# Patient Record
Sex: Male | Born: 1985 | Race: Black or African American | Hispanic: No | Marital: Married | Smoking: Never smoker
Health system: Southern US, Community
[De-identification: ages and names within clinical notes are randomized; demographics above are authoritative.]

## PROBLEM LIST (undated history)

## (undated) DIAGNOSIS — K5792 Diverticulitis of intestine, part unspecified, without perforation or abscess without bleeding: Secondary | ICD-10-CM

## (undated) HISTORY — DX: Diverticulitis of intestine, part unspecified, without perforation or abscess without bleeding: K57.92

---

## 2020-04-07 ENCOUNTER — Emergency Department (HOSPITAL_COMMUNITY): Payer: Medicaid Other

## 2020-04-07 ENCOUNTER — Emergency Department (HOSPITAL_COMMUNITY)
Admission: EM | Admit: 2020-04-07 | Discharge: 2020-04-07 | Payer: Medicaid Other | Attending: Emergency Medicine | Admitting: Emergency Medicine

## 2020-04-07 ENCOUNTER — Other Ambulatory Visit: Payer: Self-pay

## 2020-04-07 DIAGNOSIS — R042 Hemoptysis: Secondary | ICD-10-CM | POA: Diagnosis present

## 2020-04-07 LAB — COMPREHENSIVE METABOLIC PANEL
ALT: 61 U/L — ABNORMAL HIGH (ref 0–44)
AST: 72 U/L — ABNORMAL HIGH (ref 15–41)
Albumin: 4.8 g/dL (ref 3.5–5.0)
Alkaline Phosphatase: 73 U/L (ref 38–126)
Anion gap: 9 (ref 5–15)
BUN: 7 mg/dL (ref 6–20)
CO2: 25 mmol/L (ref 22–32)
Calcium: 9.3 mg/dL (ref 8.9–10.3)
Chloride: 102 mmol/L (ref 98–111)
Creatinine, Ser: 1.3 mg/dL — ABNORMAL HIGH (ref 0.61–1.24)
GFR, Estimated: >60 mL/min (ref >60)
Glucose, Bld: 91 mg/dL (ref 70–99)
Potassium: 5.3 mmol/L — ABNORMAL HIGH (ref 3.5–5.1)
Sodium: 136 mmol/L (ref 135–145)
Total Bilirubin: 0.9 mg/dL (ref 0.3–1.2)
Total Protein: 8.2 g/dL — ABNORMAL HIGH (ref 6.5–8.1)

## 2020-04-07 LAB — D-DIMER, QUANTITATIVE: D-Dimer, Quant: 0.29 ug/mL-FEU (ref 0.00–0.50)

## 2020-04-07 LAB — CBC WITH DIFFERENTIAL/PLATELET
Abs Immature Granulocytes: 0.03 10*3/uL (ref 0.00–0.07)
Basophils Absolute: 0.1 10*3/uL (ref 0.0–0.1)
Basophils Relative: 1 %
Eosinophils Absolute: 0.1 10*3/uL (ref 0.0–0.5)
Eosinophils Relative: 1 %
HCT: 45 % (ref 39.0–52.0)
Hemoglobin: 15.7 g/dL (ref 13.0–17.0)
Immature Granulocytes: 0 %
Lymphocytes Relative: 41 %
Lymphs Abs: 2.9 10*3/uL (ref 0.7–4.0)
MCH: 29.6 pg (ref 26.0–34.0)
MCHC: 34.9 g/dL (ref 30.0–36.0)
MCV: 84.7 fL (ref 80.0–100.0)
Monocytes Absolute: 0.6 10*3/uL (ref 0.1–1.0)
Monocytes Relative: 9 %
Neutro Abs: 3.4 10*3/uL (ref 1.7–7.7)
Neutrophils Relative %: 48 %
Platelets: 340 10*3/uL (ref 150–400)
RBC: 5.31 MIL/uL (ref 4.22–5.81)
RDW: 13.2 % (ref 11.5–15.5)
WBC: 7.1 10*3/uL (ref 4.0–10.5)
nRBC: 0 % (ref 0.0–0.2)

## 2020-04-07 MED ORDER — ACETAMINOPHEN 500 MG PO TABS
1000.0000 mg | ORAL_TABLET | Freq: Once | ORAL | Status: AC
  Administered 2020-04-07: 1000 mg via ORAL
  Filled 2020-04-07: qty 2

## 2020-04-07 NOTE — ED Triage Notes (Signed)
[facility] states pt in jail for 2 days. Yesterday started shob and coughing up blood, normally lives at home with wife.  Vitals 130/80 Hr 76  CBG116 98-100 RA 98.6 temp Staff at jail witnessed him cough up blood at jail, got dizzy and laid down to ground.  Denies anything that may have caused bleeding.  HX diverticulitis , cpap, anxiety Lung sounds clear per ems

## 2020-04-07 NOTE — ED Notes (Signed)
Pt from jail, officer at bedside

## 2020-04-07 NOTE — Discharge Instructions (Signed)
As discussed, your evaluation today has been largely reassuring.  But, it is important that you monitor your condition carefully, and do not hesitate to return to the ED if you develop new, or concerning changes in your condition. ? ?Otherwise, please follow-up with your physician for appropriate ongoing care. ? ?

## 2020-04-07 NOTE — ED Notes (Signed)
Police officer at bedside/ patient in handcuffs

## 2020-04-07 NOTE — ED Provider Notes (Signed)
[facility]-EMERGENCY DEPT Provider Note   CSN: [id number] Arrival date & time: 04/07/20  1611     History Chief Complaint  Patient presents with   Cough    with blood    Johnathan [facility] is a 34 y.o. male.  HPI  Patient presents from jail after an episode of hemoptysis.  Patient states that he has no medical problems, does not smoke, has been incarcerated for about 9 days.  Today, it seems though he had an episode of hemoptysis.  He also describes an episode of vomiting, but that seems to have been post tussive emesis. He answers most review of systems questioning in the affirmative, but it seems as though his presentation is secondary to the process, as above. Per nursing/police department, the patient had episode of hemoptysis, lay down on the ground, and was brought here for evaluation.    No past medical history on file. Past medical: Anxiety Past surgical: None Social: No smoking.  Currently incarcerated, married. Home Medications Prior to Admission medications   Not on File    Allergies    Patient has no allergy information on record.  Review of Systems   Review of Systems  Constitutional:       Per HPI, otherwise negative  HENT:       Per HPI, otherwise negative  Respiratory:       Per HPI, otherwise negative  Cardiovascular:       Per HPI, otherwise negative  Gastrointestinal: Positive for vomiting.  Endocrine:       Negative aside from HPI  Genitourinary:       Neg aside from HPI   Musculoskeletal:       Per HPI, otherwise negative  Skin: Negative.   Neurological: Negative for syncope and weakness.  Psychiatric/Behavioral: The patient is nervous/anxious.     Physical Exam Updated Vital Signs BP 119/73 (BP Location: Right Arm)    Pulse 65    Temp 98.9 F (37.2 C) (Oral)    Resp 20    Ht 5\' 5"  (1.651 m)    Wt 131.5 kg    SpO2 99%    BMI 48.26 kg/m   Physical Exam Vitals and nursing note reviewed.  Constitutional:       General: He is not in acute distress.    Appearance: He is well-developed.  HENT:     Head: Normocephalic and atraumatic.  Eyes:     Conjunctiva/sclera: Conjunctivae normal.  Cardiovascular:     Rate and Rhythm: Normal rate and regular rhythm.  Pulmonary:     Effort: Pulmonary effort is normal. No respiratory distress.     Breath sounds: No stridor.  Abdominal:     General: There is no distension.     Tenderness: There is no abdominal tenderness. There is no guarding.  Skin:    General: Skin is warm and dry.  Neurological:     Mental Status: He is alert and oriented to person, place, and time.     ED Results / Procedures / Treatments   Labs (all labs ordered are listed, but only abnormal results are displayed) Labs Reviewed  COMPREHENSIVE METABOLIC PANEL - Abnormal; Notable for the following components:      Result Value   Potassium 5.3 (*)    Creatinine, Ser 1.30 (*)    Total Protein 8.2 (*)    AST 72 (*)    ALT 61 (*)    All other components within normal limits  CBC WITH DIFFERENTIAL/PLATELET  D-DIMER, QUANTITATIVE (NOT AT [facility])    EKG EKG Interpretation  Date/Time:  Saturday April 07 2020 16:35:55 EDT Ventricular Rate:  83 PR Interval:    QRS Duration: 78 QT Interval:  347 QTC Calculation: 408 R Axis:   67 Text Interpretation: Sinus rhythm Consider left atrial enlargement unremarkable ECG Confirmed by Gerhard Munch [phone]) on 04/07/2020 5:05:54 PM   Radiology DG Chest Port 1 View  Result Date: 04/07/2020 CLINICAL DATA:  Hemoptysis EXAM: PORTABLE CHEST 1 VIEW COMPARISON:  None. FINDINGS: The heart size and mediastinal contours are within normal limits. Both lungs are clear. The visualized skeletal structures are unremarkable. IMPRESSION: No active disease. Electronically Signed   By: Jonna Clark M.D.   On: 04/07/2020 17:22    Procedures Procedures (including critical care time)  Medications Ordered in ED Medications  acetaminophen (TYLENOL) tablet  1,000 mg (has no administration in time range)    ED Course  I have reviewed the triage vital signs and the nursing notes.  Pertinent labs & imaging results that were available during my care of the patient were reviewed by me and considered in my medical decision making (see chart for details).  5:53 PM Patient states that he is ready to return to jail.  Labs, CXR reviewed, no notable findings, neg d dimer, low suspicion for PE, EKG unremarkable, and absent chest pain, low suspicion for atypical ACS. Patient's episode may be secondary to bronchitis, but with no hypoxia, no distress, no hemodynamic instability, no tenderness of his abdomen, patient is appropriate for discharge.   Final Clinical Impression(s) / ED Diagnoses Final diagnoses:  Hemoptysis    Rx / DC Orders ED Discharge Orders    None       Gerhard Munch, MD 04/07/20 1755

## 2020-05-24 ENCOUNTER — Other Ambulatory Visit: Payer: Self-pay

## 2020-05-24 ENCOUNTER — Encounter (HOSPITAL_COMMUNITY): Payer: Self-pay | Admitting: *Deleted

## 2020-05-24 ENCOUNTER — Emergency Department (HOSPITAL_COMMUNITY)
Admission: EM | Admit: 2020-05-24 | Discharge: 2020-05-24 | Disposition: A | Discharge status: Home / Self Care | Payer: Medicaid Other | Attending: Emergency Medicine | Admitting: Emergency Medicine

## 2020-05-24 DIAGNOSIS — Z5321 Procedure and treatment not carried out due to patient leaving prior to being seen by health care provider: Secondary | ICD-10-CM | POA: Insufficient documentation

## 2020-05-24 DIAGNOSIS — K625 Hemorrhage of anus and rectum: Secondary | ICD-10-CM | POA: Diagnosis not present

## 2020-05-24 LAB — COMPREHENSIVE METABOLIC PANEL
ALT: 23 U/L (ref 0–44)
AST: 22 U/L (ref 15–41)
Albumin: 3.6 g/dL (ref 3.5–5.0)
Alkaline Phosphatase: 66 U/L (ref 38–126)
Anion gap: 12 (ref 5–15)
BUN: 9 mg/dL (ref 6–20)
CO2: 23 mmol/L (ref 22–32)
Calcium: 9 mg/dL (ref 8.9–10.3)
Chloride: 103 mmol/L (ref 98–111)
Creatinine, Ser: 1.06 mg/dL (ref 0.61–1.24)
GFR, Estimated: >60 mL/min (ref >60)
Glucose, Bld: 107 mg/dL — ABNORMAL HIGH (ref 70–99)
Potassium: 4 mmol/L (ref 3.5–5.1)
Sodium: 138 mmol/L (ref 135–145)
Total Bilirubin: 0.9 mg/dL (ref 0.3–1.2)
Total Protein: 7 g/dL (ref 6.5–8.1)

## 2020-05-24 LAB — CBC
HCT: 42.4 % (ref 39.0–52.0)
Hemoglobin: 14.8 g/dL (ref 13.0–17.0)
MCH: 29.5 pg (ref 26.0–34.0)
MCHC: 34.9 g/dL (ref 30.0–36.0)
MCV: 84.5 fL (ref 80.0–100.0)
Platelets: 311 10*3/uL (ref 150–400)
RBC: 5.02 MIL/uL (ref 4.22–5.81)
RDW: 13.5 % (ref 11.5–15.5)
WBC: 7.4 10*3/uL (ref 4.0–10.5)
nRBC: 0 % (ref 0.0–0.2)

## 2020-05-24 LAB — SAMPLE TO BLOOD BANK

## 2020-05-24 LAB — LIPASE, BLOOD: Lipase: 24 U/L (ref 11–51)

## 2020-05-24 NOTE — ED Notes (Signed)
Pt name was called three time and no answer.

## 2020-05-24 NOTE — ED Triage Notes (Signed)
States he had a history of diverticulosis  And woke up this am went to the bathroom and noticed dark blood. C/o abd. cramping

## 2020-11-23 ENCOUNTER — Other Ambulatory Visit: Payer: Self-pay

## 2020-11-23 ENCOUNTER — Emergency Department (HOSPITAL_COMMUNITY)
Admission: EM | Admit: 2020-11-23 | Discharge: 2020-11-23 | Disposition: A | Discharge status: Home / Self Care | Payer: 59 | Attending: Emergency Medicine | Admitting: Emergency Medicine

## 2020-11-23 DIAGNOSIS — U071 COVID-19: Secondary | ICD-10-CM | POA: Diagnosis not present

## 2020-11-23 DIAGNOSIS — Z20822 Contact with and (suspected) exposure to covid-19: Secondary | ICD-10-CM

## 2020-11-23 DIAGNOSIS — J029 Acute pharyngitis, unspecified: Secondary | ICD-10-CM | POA: Diagnosis present

## 2020-11-23 LAB — RESP PANEL BY RT-PCR (FLU A&B, COVID) ARPGX2
Influenza A by PCR: NEGATIVE
Influenza B by PCR: NEGATIVE
SARS Coronavirus 2 by RT PCR: POSITIVE — AB

## 2020-11-23 LAB — GROUP A STREP BY PCR: Group A Strep by PCR: NOT DETECTED

## 2020-11-23 MED ORDER — BENZONATATE 100 MG PO CAPS: 100.0000 mg | ORAL_CAPSULE | Freq: Three times a day (TID) | ORAL | 0 refills | Status: AC

## 2020-11-23 MED ORDER — ACETAMINOPHEN 500 MG PO TABS: 500.0000 mg | ORAL_TABLET | Freq: Four times a day (QID) | ORAL | 0 refills | Status: AC | PRN

## 2020-11-23 NOTE — ED Provider Notes (Signed)
[facility] EMERGENCY DEPARTMENT Provider Note   CSN: [id number] Arrival date & time: 11/23/20  1002     History No chief complaint on file.   John[facility] is a 35 y.o. male.  The history is provided by the patient. No language interpreter was used.   35 year old male who presents with cold symptoms.  Patient states he was sent here from his chart for possible COVID exposure as one of his coworker recently tested positive for COVID.  Patient mention for the past 3 days he has had body aches, sore throat, congestion, nausea, vomiting diarrhea, productive cough, loss of taste and smell, and overall not feeling well.  He has been eating ice cream to help soothe his throat but no other treatment.  He did mention that his coworker who works in close contact tested positive for COVID yesterday.  He has been vaccinated for COVID-19.  Does have history of sleep apnea but rarely ever uses CPAP machine.  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    Past Medical History:  Diagnosis Date   Diverticulitis     There are no problems to display for this patient.   No past surgical history on file.     No family history on file.  Social History   Tobacco Use   Smoking status: Never   Smokeless tobacco: Never  Substance Use Topics   Alcohol use: Never   Drug use: Never    Home Medications Prior to Admission medications   Not on File    Allergies    Patient has no allergy information on record.  Review of Systems   Review of Systems  All other systems reviewed and are negative.  Physical Exam Updated Vital Signs BP (!) 153/80   Pulse (!) 105   Temp 98.9 F (37.2 C) (Oral)   Resp 18   SpO2 97%   Physical Exam Vitals and nursing note reviewed.  Constitutional:      General: He is not in acute distress.    Appearance: He is well-developed.  HENT:     Head:  Atraumatic.     Nose: Nose normal.     Mouth/Throat:     Mouth: Mucous membranes are moist.  Eyes:     Conjunctiva/sclera: Conjunctivae normal.  Cardiovascular:     Rate and Rhythm: Normal rate and regular rhythm.     Pulses: Normal pulses.     Heart sounds: Normal heart sounds.  Pulmonary:     Effort: Pulmonary effort is normal. No respiratory distress.     Breath sounds: Normal breath sounds. No stridor. No wheezing or rales.  Abdominal:     Palpations: Abdomen is soft.  Musculoskeletal:     Cervical back: Normal range of motion and neck supple. No rigidity or tenderness.  Skin:    Findings: No rash.  Neurological:     Mental Status: He is alert.  Psychiatric:        Mood and Affect: Mood normal.    ED Results / Procedures / Treatments   Labs (all labs ordered are listed, but only abnormal results are displayed) Labs Reviewed  RESP PANEL BY RT-PCR (FLU A&B, COVID) ARPGX2  GROUP A STREP BY PCR    EKG None  Radiology No results found.  Procedures Procedures   Medications Ordered in ED Medications - No data to display  ED Course  I have reviewed the triage vital signs and the nursing notes.  Pertinent labs & imaging results that were available during my care of the patient were reviewed by me and considered in my medical decision making (see chart for details).    MDM Rules/Calculators/A&P                          BP (!) 153/80   Pulse (!) 105   Temp 98.9 F (37.2 C) (Oral)   Resp 18   SpO2 97%   Final Clinical Impression(s) / ED Diagnoses Final diagnoses:  Suspected COVID-19 virus infection    Rx / DC Orders ED Discharge Orders     None      11:29 AM Patient with recent COVID exposure who presents with 3-day onset of cold symptoms concerning for COVID infection.  Throat exam fairly unremarkable.  Strep test as well as respiratory panel have been obtained however I anticipate patient is likely will test positive for COVID infection.  He is not  hypoxic he is afebrile he does not have any airway compromise.  Patient overall well-appearing, will provide supportive care.  Work note provided.  Johnathan [facility] was evaluated in Emergency Department on 11/23/2020 for the symptoms described in the history of present illness. He was evaluated in the context of the global COVID-19 pandemic, which necessitated consideration that the patient might be at risk for infection with the SARS-CoV-2 virus that causes COVID-19. Institutional protocols and algorithms that pertain to the evaluation of patients at risk for COVID-19 are in a state of rapid change based on information released by regulatory bodies including the CDC and federal and state organizations. These policies and algorithms were followed during the patient's care in the ED.    Johnathan Helper, PA-C 11/23/20 1135    Johnathan Fossa, MD 11/23/20 1432

## 2020-11-23 NOTE — Discharge Instructions (Addendum)
You have been evaluated for possible COVID infection.  You may follow-up on the test result through MyChart, link below.  If test positive, follow instruction below.  Your strep test is negative  Recommendations for at home COVID-19 symptoms management:  Please continue isolation at home. Call [phone] to see whether you might be eligible for therapeutic antibody infusions (leave your name and they will call you back).  If have acute worsening of symptoms please go to ER/urgent care for further evaluation. Check pulse oximetry and if below 90-92% please go to ER. The following supplements MAY help:  Vitamin C 500mg  twice a day and Quercetin 250-500 mg twice a day Vitamin D3 2000 - 4000 u/day B Complex vitamins Zinc 75-100 mg/day Melatonin 6-10 mg at night (the optimal dose is unknown) Aspirin 81mg /day (if no history of bleeding issues)

## 2020-11-23 NOTE — ED Triage Notes (Signed)
Pt sent here from his job for a possible covid exposure , his coworker just tested positive , pt with sore throat sinus pressure along with some diarrhea

## 2020-11-29 ENCOUNTER — Emergency Department (HOSPITAL_COMMUNITY)
Admission: EM | Admit: 2020-11-29 | Discharge: 2020-11-29 | Disposition: A | Discharge status: Home / Self Care | Payer: Medicaid Other | Attending: Emergency Medicine | Admitting: Emergency Medicine

## 2020-11-29 DIAGNOSIS — R0602 Shortness of breath: Secondary | ICD-10-CM | POA: Diagnosis not present

## 2020-11-29 DIAGNOSIS — Z5321 Procedure and treatment not carried out due to patient leaving prior to being seen by health care provider: Secondary | ICD-10-CM | POA: Insufficient documentation

## 2020-11-29 NOTE — ED Triage Notes (Signed)
Pt c/o covid symptoms x3-4 days. States he is [facility]. SpO2 100% RA, pt able to speak in complete sentences. Afebrile, NAD in triage. Pt vaccinated against covid, states he got it form coworker.

## 2021-07-25 IMAGING — DX DG CHEST 1V PORT
1 series · 1 of 1 positions shown · non-contrast
Comparison: None.

CLINICAL DATA: Hemoptysis

EXAM:
PORTABLE CHEST 1 VIEW

[chest ap]
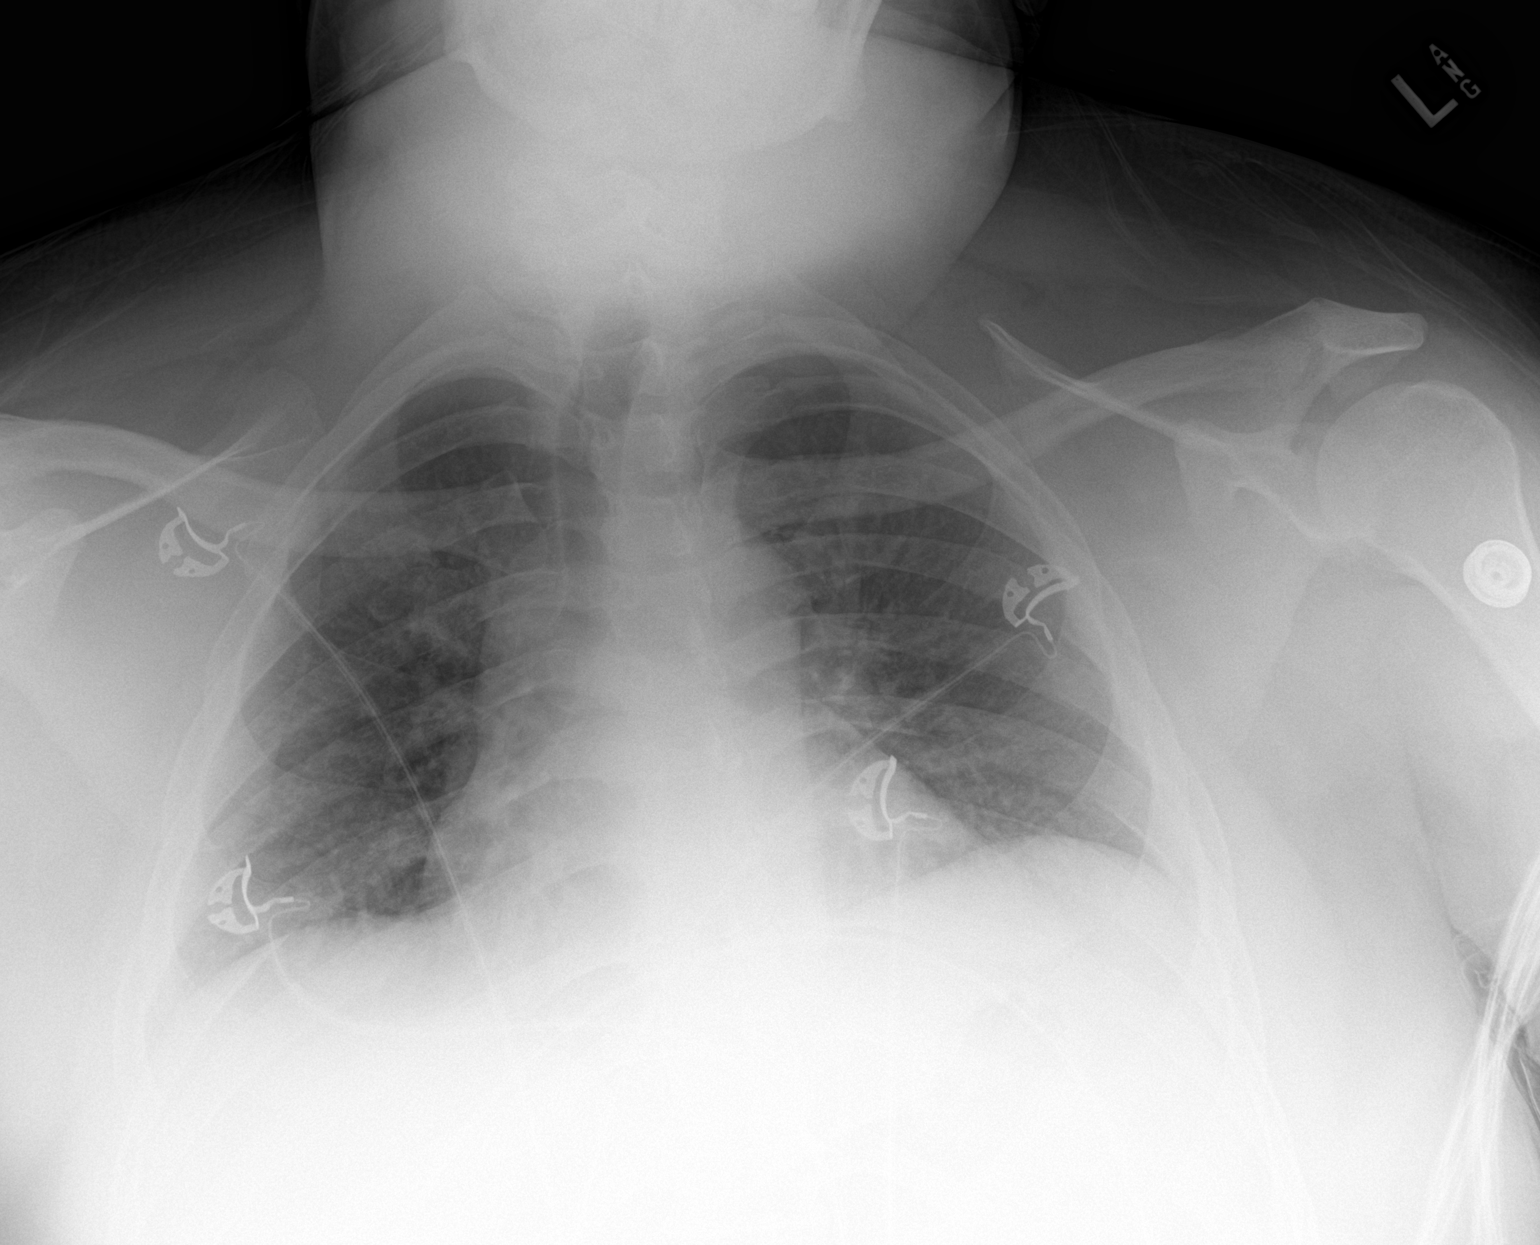

[1 of 1 positions shown; findings below may reference images not displayed]

FINDINGS: The heart size and mediastinal contours are within normal limits.
Both lungs are clear. The visualized skeletal structures are
unremarkable.
IMPRESSION: No active disease.
